# Patient Record
Sex: Female | Born: 1995 | Race: White | Hispanic: No | Marital: Single | Smoking: Current some day smoker
Health system: Southern US, Community
[De-identification: ages and names within clinical notes are randomized; demographics above are authoritative.]

## PROBLEM LIST (undated history)

## (undated) DIAGNOSIS — IMO0001 Reserved for inherently not codable concepts without codable children: Secondary | ICD-10-CM

## (undated) DIAGNOSIS — F1911 Other psychoactive substance abuse, in remission: Secondary | ICD-10-CM

## (undated) DIAGNOSIS — H9190 Unspecified hearing loss, unspecified ear: Secondary | ICD-10-CM

## (undated) DIAGNOSIS — J45909 Unspecified asthma, uncomplicated: Secondary | ICD-10-CM

## (undated) DIAGNOSIS — Z9889 Other specified postprocedural states: Secondary | ICD-10-CM

## (undated) HISTORY — PX: TYMPANOSTOMY TUBE PLACEMENT: SHX32

---
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
## 2017-09-25 ENCOUNTER — Other Ambulatory Visit: Payer: Self-pay

## 2017-09-26 ENCOUNTER — Encounter (HOSPITAL_COMMUNITY): Payer: Self-pay

## 2017-09-26 ENCOUNTER — Other Ambulatory Visit (HOSPITAL_COMMUNITY): Payer: Self-pay | Admitting: Unknown Physician Specialty

## 2017-09-27 ENCOUNTER — Other Ambulatory Visit: Payer: Self-pay

## 2017-09-30 ENCOUNTER — Ambulatory Visit (HOSPITAL_COMMUNITY)
Admission: RE | Admit: 2017-09-30 | Discharge: 2017-09-30 | Disposition: A | Discharge status: Home / Self Care | Payer: Medicaid Other | Source: Ambulatory Visit | Attending: Unknown Physician Specialty | Admitting: Unknown Physician Specialty

## 2017-09-30 ENCOUNTER — Other Ambulatory Visit (HOSPITAL_COMMUNITY): Payer: Self-pay | Admitting: Unknown Physician Specialty

## 2017-09-30 ENCOUNTER — Encounter (HOSPITAL_COMMUNITY): Payer: Self-pay

## 2017-09-30 ENCOUNTER — Ambulatory Visit (HOSPITAL_COMMUNITY): Payer: Medicaid Other

## 2017-09-30 ENCOUNTER — Other Ambulatory Visit (HOSPITAL_COMMUNITY): Payer: Self-pay | Admitting: *Deleted

## 2017-09-30 DIAGNOSIS — Z3A12 12 weeks gestation of pregnancy: Secondary | ICD-10-CM | POA: Diagnosis not present

## 2017-09-30 DIAGNOSIS — D181 Lymphangioma, any site: Secondary | ICD-10-CM

## 2017-09-30 DIAGNOSIS — O358XX9 Maternal care for other (suspected) fetal abnormality and damage, other fetus: Secondary | ICD-10-CM | POA: Diagnosis not present

## 2017-09-30 DIAGNOSIS — O289 Unspecified abnormal findings on antenatal screening of mother: Secondary | ICD-10-CM

## 2017-09-30 DIAGNOSIS — O358XX0 Maternal care for other (suspected) fetal abnormality and damage, not applicable or unspecified: Secondary | ICD-10-CM

## 2017-09-30 DIAGNOSIS — O283 Abnormal ultrasonic finding on antenatal screening of mother: Secondary | ICD-10-CM

## 2017-09-30 HISTORY — DX: Reserved for inherently not codable concepts without codable children: IMO0001

## 2017-09-30 HISTORY — DX: Other psychoactive substance abuse, in remission: F19.11

## 2017-09-30 HISTORY — DX: Unspecified hearing loss, unspecified ear: H91.90

## 2017-09-30 NOTE — Addendum Note (Signed)
Encounter addended by: Novella Rob, [place] on: 09/30/2017 1:01 PM  Actions taken: Imaging Exam ended

## 2017-09-30 NOTE — Addendum Note (Signed)
Encounter addended by: Marko Stai, RDMS on: 09/30/2017 4:56 PM  Actions taken: Imaging Exam ended

## 2017-09-30 NOTE — Addendum Note (Signed)
Encounter addended by: Abigail Butts, RDMS, RVT on: 09/30/2017 1:39 PM  Actions taken: Imaging Exam ended

## 2017-09-30 NOTE — Addendum Note (Signed)
Encounter addended by: Novella Rob, RDMS on: 09/30/2017 10:49 AM  Actions taken: Imaging Exam ended

## 2017-09-30 NOTE — Addendum Note (Signed)
Encounter addended by: Novella Rob, [place] on: 09/30/2017 1:01 PM  Actions taken: Imaging Exam ended

## 2017-09-30 NOTE — Addendum Note (Signed)
Encounter addended by: Abigail Butts, RDMS, RVT on: 09/30/2017 1:38 PM  Actions taken: Imaging Exam ended

## 2017-09-30 NOTE — Addendum Note (Signed)
Encounter addended by: Jill Poling, RDMS on: 09/30/2017 6:43 PM  Actions taken: Imaging Exam ended

## 2017-09-30 NOTE — Addendum Note (Signed)
Encounter addended by: Eusebio Friendly, RT, RVT, RDMS on: 09/30/2017 7:01 PM  Actions taken: Imaging Exam ended

## 2017-09-30 NOTE — Addendum Note (Signed)
Encounter addended by: Eusebio Friendly, RT, RVT, RDMS on: 09/30/2017 6:46 PM  Actions taken: Imaging Exam ended

## 2017-10-02 DIAGNOSIS — Z3A12 12 weeks gestation of pregnancy: Secondary | ICD-10-CM | POA: Insufficient documentation

## 2017-10-02 DIAGNOSIS — O283 Abnormal ultrasonic finding on antenatal screening of mother: Secondary | ICD-10-CM | POA: Insufficient documentation

## 2017-10-02 NOTE — Progress Notes (Signed)
Genetic Counseling  High-Risk Gestation Note  Appointment Date:  10/02/2017 Referred By: Pennie Rushing, MD Date of Birth:  [birth year] Partner: [name]   Pregnancy History: [id number] Estimated Date of Delivery: 04/13/18 Estimated Gestational Age: [redacted]w[redacted]d Attending: Griffin Dakin, MD  [name] and her partner, [name], were seen for genetic counseling because of increased fetal nuchal translucency on ultrasound.     Reviewed ultrasound findings and the association with fetal aneuploidy  Discussed additional associations including single gene, structural defect, variant of normal  Offered additional screening  NIPS for aneuploidy- currently pending, drawn 09/25/17 at [facility] office  NIPS for single gene- declined today  Detailed ultrasound- scheduled in second trimester  Discussed option of diagnostic testing  Amniocentesis- declined at this time  Reviewed ACOG carrier screening options- declined  Reviewed family history concerns   The patient had a nuchal translucency (NT) ultrasound at the referring provider's office, which revealed an NT measurement of 3.9 mm at . Ultrasound was performed today and also visualized increased NT. Complete ultrasound reported under separate cover. We discussed that the fetal NT refers to a fluid filled space between the skin and soft tissues behind the cervical spine. This space is traditionally measurable between 11 and 13.[redacted] weeks gestation and is considered enlarged when the measurement is equal to or greater than the 95th percentile for the gestational age. This couple was counseled regarding the various common etiologies for an enlarged NT including: aneuploidy, single gene conditions, cardiac or great vessel abnormalities, lymphatic system failure, decreased fetal movement, and fetal anemia.   We reviewed chromosomes, nondisjunction, and the common features and prognoses of Down syndrome and other aneuploidies. Considering KathrynTran's age, an NT of 3.9  mm and a CRL of 54 mm, the risk for fetal aneuploidy is estimated to be approximately 11%. They were then counseled regarding the availability of aneuploidy screening options. The patient had noninvasive prenatal screening (NIPS)/prenatal cell free DNA testing drawn through her OB provider ([facility]) on 09/25/17. The results are pending.   We spent time reviewing the methodology of NIPS/cfDNA including the conditions for which it screens, detection rates and false positive rates of each. They were counseled that screening tests are used to modify a patient's a priori risk for aneuploidy, typically based on age. This estimate provides a pregnancy specific risk assessment. We reviewed the benefits and limitations of screening options including NIPS and detailed ultrasound. They were also counseled regarding diagnostic testing via [facility] and amniocentesis. We reviewed the associated risks for complications, including spontaneous pregnancy loss. We discussed the possible results that the tests might provide including: positive, negative, unanticipated, and no result. Finally, they were counseled regarding the cost of each option and potential out of pocket expenses. [name] declined invasive testing at this time, as they prefer to await results of NIPS for aneuploidy before deciding on additional genetic testing in the pregnancy.   In addition, we discussed that an NT of 3.9 mm is associated with an approximate 3% risk for a fetal cardiac anomaly. We discussed the options of a fetal echocardiogram and detailed anatomy ultrasound in second trimester as methods to evaluate the fetal heart. We discussed that less commonly an increased NT can be associated with other structural anomalies and reviewed the benefits and limitations of second trimester ultrasound to assess fetal growth and anatomy. Detailed ultrasound is scheduled for 11/11/17.   We also discussed single gene conditions. They were  counseled that an increased NT is associated with an  increased chance for specific single gene conditions including Noonan spectrum disorders, skeletal dysplasias, SLOS, CdLS, and many others. We discussed that these conditions are not routinely tested for prenatally unless ultrasound findings or family history significantly increase the suspicion of a specific single gene disorder; however, there is new noninvasive prenatal screening (NIPS) technology which assesses for specific alterations in 30 genes, including the genes associated with Noonan syndrome, some skeletal dysplasias, and CdLS. We discussed that this testing, marketed as Senaida Ores, is available and requires both maternal and paternal sample to be performed. We reviewed the benefits, limitations, and cost of this technology. They understand that many of the features of these conditions can be detected by detailed ultrasound during the second trimester; however, ultrasound cannot definitively diagnose or rule out these conditions. We briefly reviewed common inheritance patterns (dominant, recessive, and X-linked) as well as the associated risks of recurrence. The couple declined single gene NIPS at this time given that [name] is currently pending. They prefer to await results of NIPS for aneuploidy prior to pursuing additional genetic screening or testing at this time.   We then discussed that an increased NT value can be a normal variant, which can resolve during the pregnancy. They were counseled that the fetal prognosis depends on the underlying etiology of the enlarged NT and further anticipatory guidance can be provided if a diagnosis is discovered. They understand that the legal limit for TAB in [place] is [redacted] weeks gestation. The couple did not indicate that they were considering TAB as an option in the current pregnancy.   Kathryn [name] was provided with written information regarding cystic fibrosis (CF), spinal muscular atrophy (SMA) and  hemoglobinopathies including the carrier frequency, availability of carrier screening and prenatal diagnosis if indicated.  In addition, we discussed that CF and hemoglobinopathies are routinely screened for as part of the [place] newborn screening panel.  After further discussion, she declined screening for CF, SMA and hemoglobinopathies today.  Both family histories were reviewed and found to be contributory for hearing loss for Ms. Terhaar. 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 She reported that the hearing loss is attributed to trauma at birth and did not describe it be progressive. We reviewed the hearing loss can be categorized as either prelingual (occurring prior to the development of speech) or postlingual (occurring after the development of normal speech). Prelingual hearing loss includes congenital hearing loss but does not necessarily have to be congenital. Additionally, hearing loss is typically categorized as either syndromic or nonsyndromic. Congenital hearing loss can be hereditary or nonhereditary. It is estimated that for prelingual hearing loss, approximately 25% is idiopathic; approximately 25% is non-genetic; and approximately 50% or greater is genetic. Nonhereditary causes of prelingual hearing loss include teratogenic exposures in pregnancy, such as congenital infection or medications, or postnatal exposures, such as infections or medication use. Regarding genetic causes of prelingual hearing loss, approximately 30% is syndromic(including over 400 genetic syndromes), and approximately 70% is nonsyndromic. There are numerous forms of both syndromic and nonsyndromic hereditary hearing loss following various patterns of inheritance including autosomal recessive, autosomal dominant, X-linked, and mitochondrial  inheritance. We discussed that the reported history for Ms. Heacox is not particularly suggestive of a syndromic cause for her hearing loss. In the case of an environmental cause, recurrence risk  would not be increased for her offspring. In the case of an underlying genetic cause, recurrence risk may be increased depending upon the underlying mechanism. Further genetic evaluation and workup may be available to Ms. Cotto if desired.    Ms. Winiarski reported her nephew (her brother's son) has autism or unknown etiology. Autism is part of the spectrum of conditions referred to as Autistic spectrum disorders (ASD).  ASDs are among the most common neurodevelopmental disorders, with approximately 1 in 68 children meeting criteria for ASD, according to the Centers for Disease Control. Approximately 80% of individuals diagnosed are female. There is strong evidence that genetic factors play a critical role in development of ASD. There have been recent advances in identifying specific genetic causes of ASD, however, there are still many individuals for whom the etiology of the ASD is not known. The majority of individuals with ASD (70-80%) have essential autism. There is strong evidence that genetic factors play a critical role in development of ASD. Some individuals with ASDs are found to have causative differences in karyotype analysis, chromosomal microarray analysis, or single genes. These are more likely to be identified in individuals with complex autism spectrum disorders.  Once a couple has a child with a diagnosis of ASD, there is a 13.5% chance to have another child with ASD. Recurrence risk data are more limited for extended degree relatives. The patient is aware that for many individuals with autism spectrum disorders an underlying genetic cause is not identified at this time. In the absence of an identified genetic etiology, prenatal screening or testing would not be available in the current pregnancy for the  autism spectrum disorders in the family.   The family histories were otherwise found to be noncontributory for birth defects, intellectual disability, and known genetic conditions. Ms. Toback reported Caucasian ancestry, and her partner reported African American ancestry. Consanguinity was denied. Without further information regarding the provided family history, an accurate genetic risk cannot be calculated. Further genetic counseling is warranted if more information is obtained.   Ms. BRITT THEARD denied exposure to environmental toxins or chemical agents. She denied the use of alcohol or street drugs. She reported smoking less than 1 cigarette per day on average. The associations of smoking in pregnancy were reviewed and cessation encouraged. She denied significant viral illnesses during the course of her pregnancy.    I counseled this couple regarding the above risks and available options. Most of the counseling was provided by Carmin Richmond, [employer] genetic counseling student, under my direct supervision. The approximate face-to-face time with the genetic counselor was 40 minutes.     Chipper Oman, MS Certified Genetic Counselor 10/02/2017

## 2017-10-14 ENCOUNTER — Other Ambulatory Visit: Payer: Self-pay

## 2017-11-10 ENCOUNTER — Encounter (HOSPITAL_COMMUNITY): Payer: Self-pay

## 2017-11-11 ENCOUNTER — Ambulatory Visit (HOSPITAL_COMMUNITY)
Admission: RE | Admit: 2017-11-11 | Discharge: 2017-11-11 | Disposition: A | Discharge status: Home / Self Care | Payer: Medicaid Other | Source: Ambulatory Visit | Attending: Unknown Physician Specialty | Admitting: Unknown Physician Specialty

## 2017-11-11 ENCOUNTER — Other Ambulatory Visit (HOSPITAL_COMMUNITY): Payer: Self-pay | Admitting: Obstetrics and Gynecology

## 2017-11-11 ENCOUNTER — Encounter (HOSPITAL_COMMUNITY): Payer: Self-pay

## 2017-11-11 DIAGNOSIS — Z3A18 18 weeks gestation of pregnancy: Secondary | ICD-10-CM | POA: Insufficient documentation

## 2017-11-11 DIAGNOSIS — O280 Abnormal hematological finding on antenatal screening of mother: Secondary | ICD-10-CM

## 2017-11-11 DIAGNOSIS — Z363 Encounter for antenatal screening for malformations: Secondary | ICD-10-CM | POA: Insufficient documentation

## 2017-11-11 DIAGNOSIS — F1210 Cannabis abuse, uncomplicated: Secondary | ICD-10-CM

## 2017-11-11 DIAGNOSIS — O99212 Obesity complicating pregnancy, second trimester: Secondary | ICD-10-CM | POA: Diagnosis not present

## 2017-11-11 DIAGNOSIS — O358XX0 Maternal care for other (suspected) fetal abnormality and damage, not applicable or unspecified: Secondary | ICD-10-CM

## 2017-11-11 DIAGNOSIS — O283 Abnormal ultrasonic finding on antenatal screening of mother: Secondary | ICD-10-CM

## 2017-11-11 DIAGNOSIS — O289 Unspecified abnormal findings on antenatal screening of mother: Secondary | ICD-10-CM | POA: Insufficient documentation

## 2017-11-11 HISTORY — DX: Other specified postprocedural states: Z98.890

## 2017-11-12 ENCOUNTER — Other Ambulatory Visit (HOSPITAL_COMMUNITY): Payer: Self-pay | Admitting: *Deleted

## 2017-11-12 DIAGNOSIS — D181 Lymphangioma, any site: Secondary | ICD-10-CM

## 2017-11-13 ENCOUNTER — Other Ambulatory Visit (HOSPITAL_COMMUNITY): Payer: Self-pay | Admitting: *Deleted

## 2017-11-13 DIAGNOSIS — O358XX0 Maternal care for other (suspected) fetal abnormality and damage, not applicable or unspecified: Secondary | ICD-10-CM

## 2017-12-09 ENCOUNTER — Encounter (HOSPITAL_COMMUNITY): Payer: Self-pay

## 2017-12-09 ENCOUNTER — Other Ambulatory Visit (HOSPITAL_COMMUNITY): Payer: Self-pay | Admitting: Obstetrics and Gynecology

## 2017-12-09 ENCOUNTER — Other Ambulatory Visit (HOSPITAL_COMMUNITY): Payer: Self-pay

## 2017-12-09 ENCOUNTER — Ambulatory Visit (HOSPITAL_COMMUNITY)
Admission: RE | Admit: 2017-12-09 | Discharge: 2017-12-09 | Disposition: A | Discharge status: Home / Self Care | Payer: Medicaid Other | Source: Ambulatory Visit | Attending: Unknown Physician Specialty | Admitting: Unknown Physician Specialty

## 2017-12-09 DIAGNOSIS — O99212 Obesity complicating pregnancy, second trimester: Secondary | ICD-10-CM

## 2017-12-09 DIAGNOSIS — F1210 Cannabis abuse, uncomplicated: Secondary | ICD-10-CM

## 2017-12-09 DIAGNOSIS — Z3A22 22 weeks gestation of pregnancy: Secondary | ICD-10-CM | POA: Diagnosis not present

## 2017-12-09 DIAGNOSIS — D181 Lymphangioma, any site: Secondary | ICD-10-CM

## 2017-12-09 DIAGNOSIS — O358XX9 Maternal care for other (suspected) fetal abnormality and damage, other fetus: Secondary | ICD-10-CM | POA: Insufficient documentation

## 2017-12-09 DIAGNOSIS — O289 Unspecified abnormal findings on antenatal screening of mother: Secondary | ICD-10-CM

## 2017-12-09 DIAGNOSIS — O280 Abnormal hematological finding on antenatal screening of mother: Secondary | ICD-10-CM

## 2017-12-09 DIAGNOSIS — O283 Abnormal ultrasonic finding on antenatal screening of mother: Secondary | ICD-10-CM

## 2017-12-09 NOTE — Addendum Note (Signed)
Encounter addended by: Novella Rob, RDMS on: 12/09/2017 3:51 PM  Actions taken: Imaging Exam ended

## 2018-01-06 ENCOUNTER — Ambulatory Visit (HOSPITAL_COMMUNITY)
Admission: RE | Admit: 2018-01-06 | Discharge: 2018-01-06 | Disposition: A | Discharge status: Home / Self Care | Payer: Medicaid Other | Source: Ambulatory Visit | Attending: Unknown Physician Specialty | Admitting: Unknown Physician Specialty

## 2018-01-06 ENCOUNTER — Encounter (HOSPITAL_COMMUNITY): Payer: Self-pay

## 2018-01-06 ENCOUNTER — Other Ambulatory Visit (HOSPITAL_COMMUNITY): Payer: Self-pay | Admitting: Obstetrics and Gynecology

## 2018-01-06 DIAGNOSIS — Q909 Down syndrome, unspecified: Secondary | ICD-10-CM | POA: Diagnosis not present

## 2018-01-06 DIAGNOSIS — O321XX0 Maternal care for breech presentation, not applicable or unspecified: Secondary | ICD-10-CM | POA: Diagnosis not present

## 2018-01-06 DIAGNOSIS — Z3A26 26 weeks gestation of pregnancy: Secondary | ICD-10-CM

## 2018-01-06 DIAGNOSIS — Z362 Encounter for other antenatal screening follow-up: Secondary | ICD-10-CM | POA: Diagnosis present

## 2018-01-06 DIAGNOSIS — O358XX0 Maternal care for other (suspected) fetal abnormality and damage, not applicable or unspecified: Secondary | ICD-10-CM | POA: Insufficient documentation

## 2018-01-06 DIAGNOSIS — O289 Unspecified abnormal findings on antenatal screening of mother: Secondary | ICD-10-CM | POA: Diagnosis not present

## 2018-01-06 DIAGNOSIS — O99212 Obesity complicating pregnancy, second trimester: Secondary | ICD-10-CM | POA: Insufficient documentation

## 2018-01-06 DIAGNOSIS — O283 Abnormal ultrasonic finding on antenatal screening of mother: Secondary | ICD-10-CM

## 2018-07-06 ENCOUNTER — Encounter (HOSPITAL_COMMUNITY): Payer: Self-pay

## 2019-07-02 IMAGING — US US MFM OB FOLLOW-UP
1 series · 14 of 28 positions shown · non-contrast
Comparison: none

[Series 1: us mfm ob follow-up · 53 acquisitions, 14 frames shown]
[im 2/53]
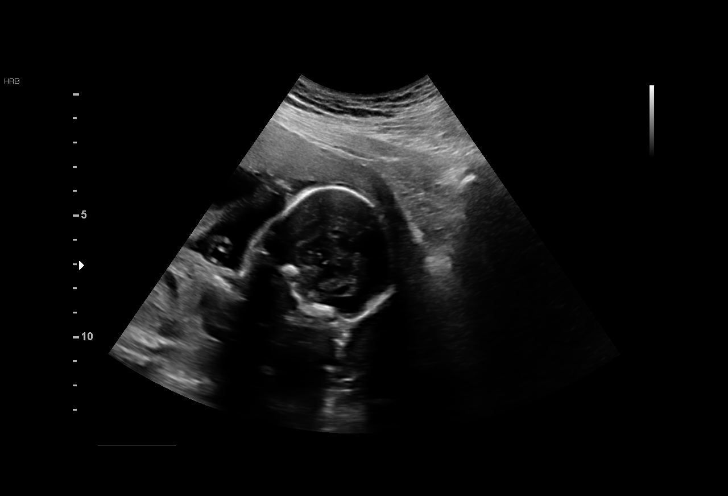
[im 6/53]
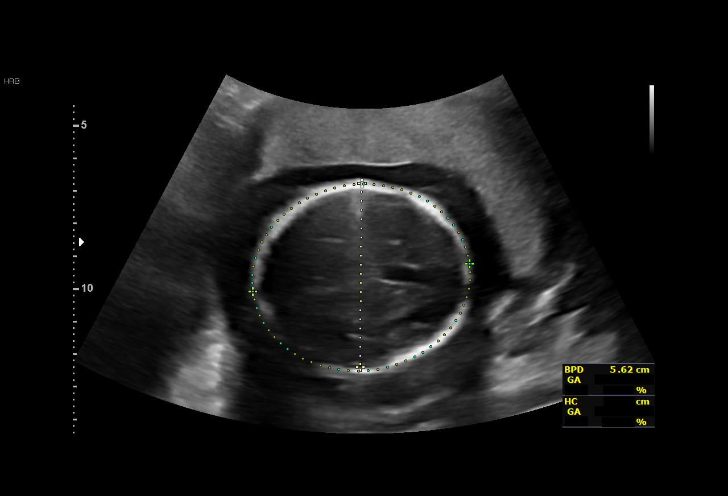
[im 10/53]
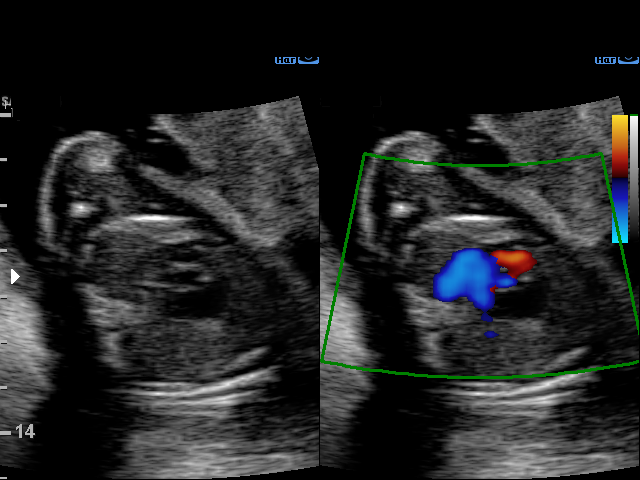
[im 14/53]
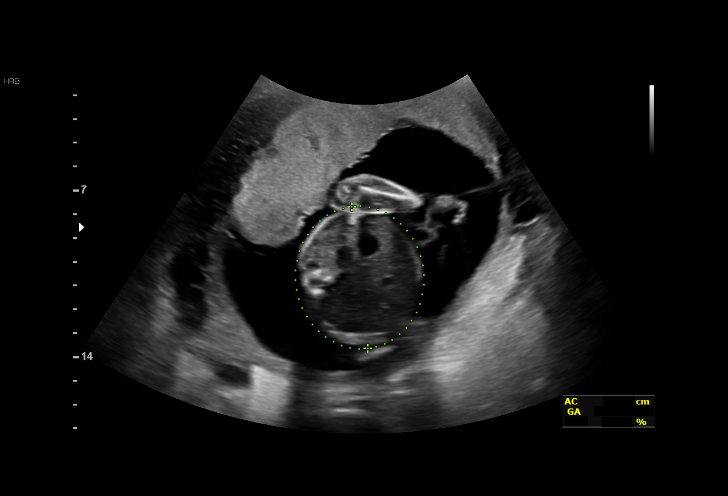
[im 18/53]
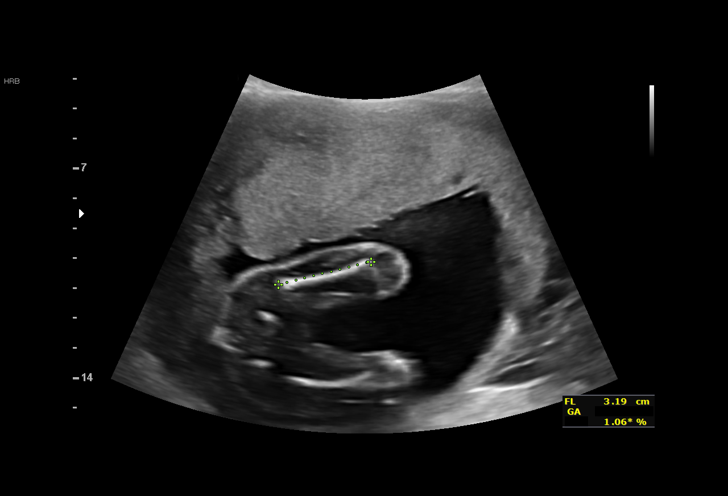
[im 22/53]
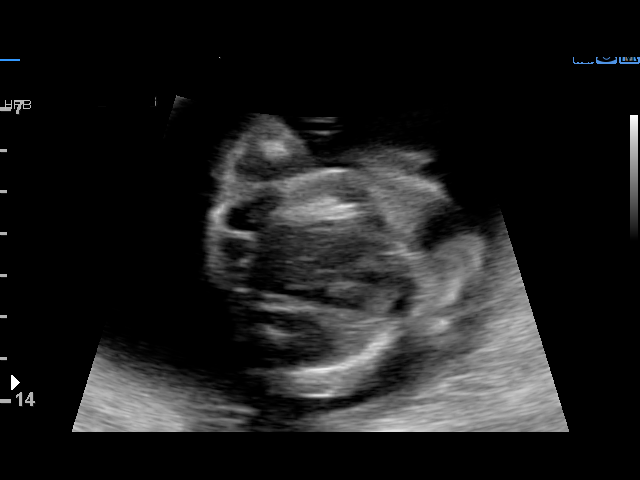
[im 26/53]
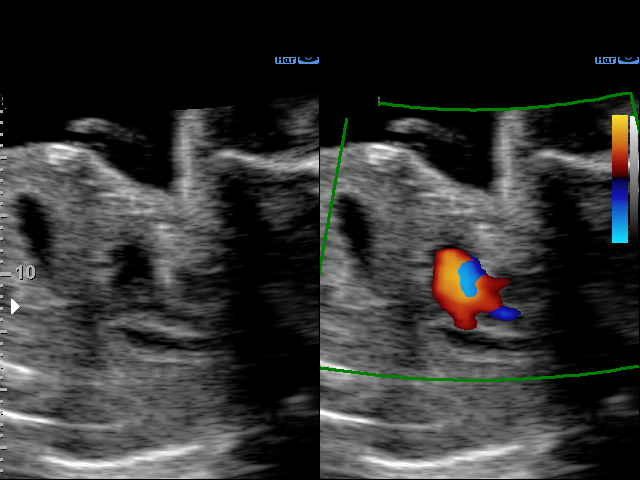
[im 29/53]
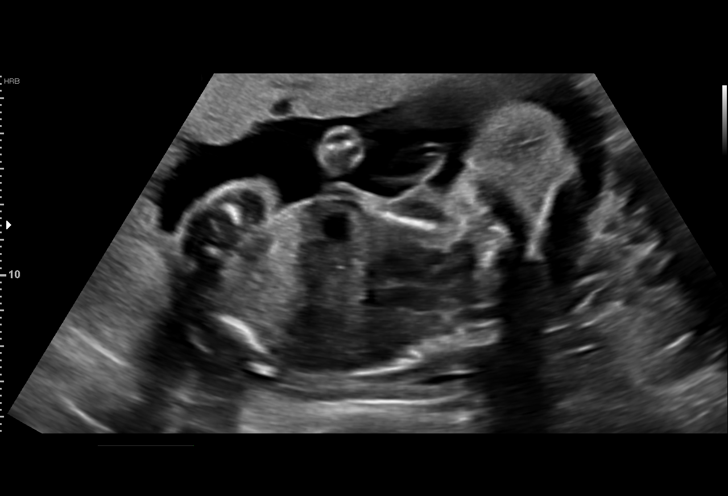
[im 33/53]
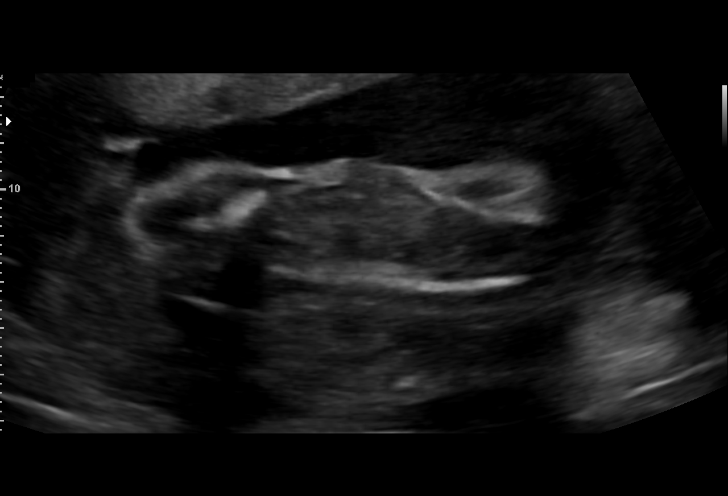
[im 37/53]
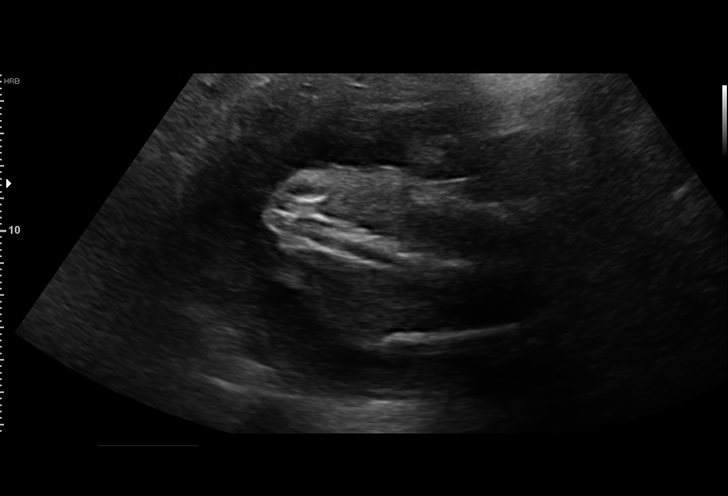
[im 41/53]
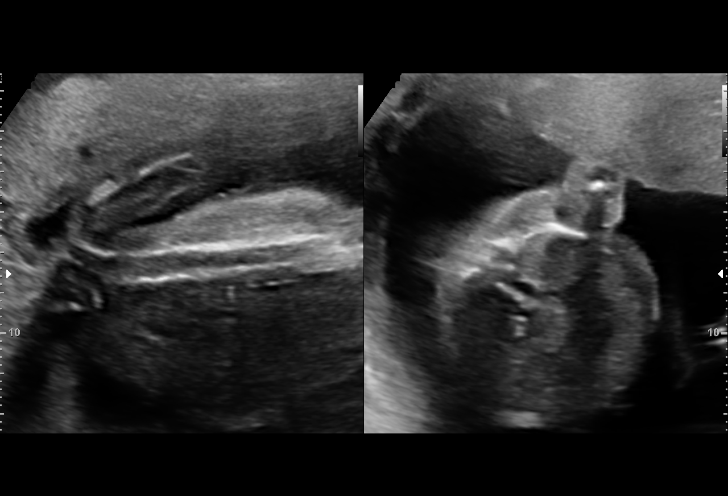
[im 45/53]
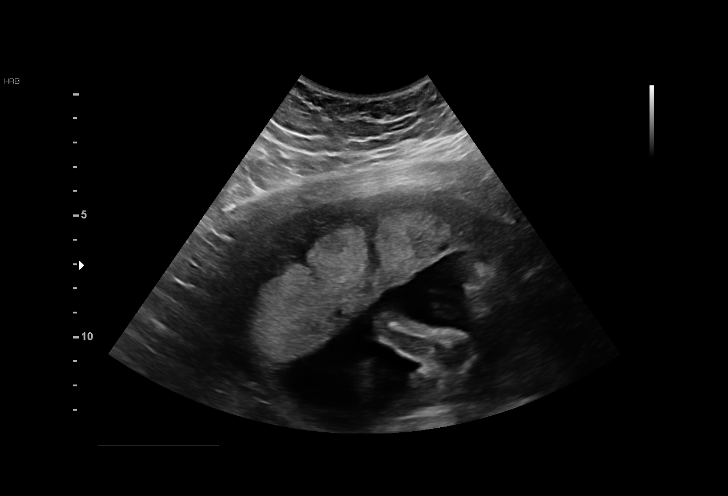
[im 49/53]
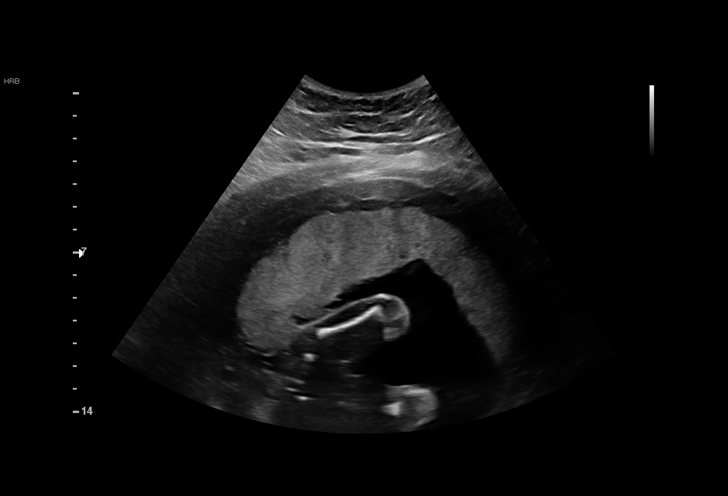
[im 53/53]
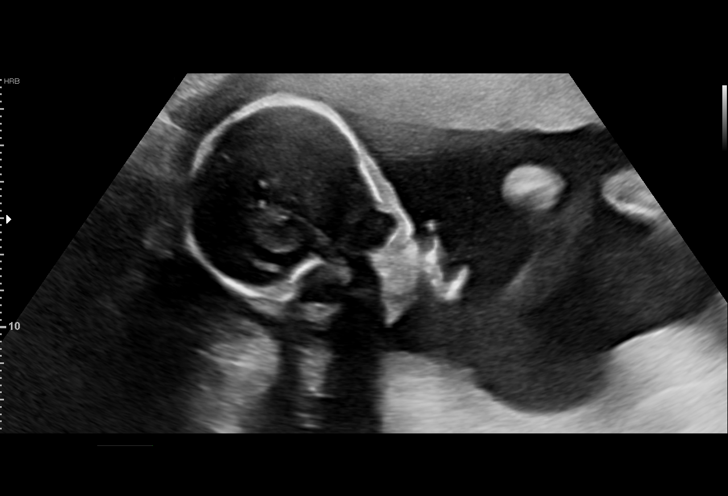

[14 of 28 positions shown; findings below may reference images not displayed]

Centre
[address],

Indications

22 weeks gestation of pregnancy
Abnormal first trimester screen (NT)
Cystic hygroma
Marijuana Abuse
Abnormal biochemical screen (quad) for
Trisomy 21 ([DATE])
Obesity complicating pregnancy, second
trimester
OB History

Gravidity:    2         Term:   1        Prem:   0        SAB:   0
TOP:          0       Ectopic:  0        Living: 1
Fetal Evaluation

Num Of Fetuses:     1
Cardiac Activity:   Observed
Placenta:           Anterior, above cervical os

Amniotic Fluid
AFI FV:      Subjectively within normal limits

Largest Pocket(cm)
7.88
Biometry

BPD:        56  mm     G. Age:  23w 1d         81  %    CI:        78.87   %    70 - 86
FL/HC:      15.8   %    18.4 -
HC:      199.4  mm     G. Age:  22w 1d         37  %    HC/AC:      1.11        1.06 -
AC:      180.4  mm     G. Age:  22w 6d         66  %    FL/BPD:     56.3   %    71 - 87
FL:       31.5  mm     G. Age:  19w 6d        < 3  %    FL/AC:      17.5   %    20 - 24
HUM:      28.2  mm     G. Age:  19w 1d        < 5  %

Est. FW:     439  gm    0 lb 15 oz      33  %
Gestational Age

LMP:           23w 3d        Date:  06/28/17                 EDD:   04/04/18
U/S Today:     22w 0d                                        EDD:   04/14/18
Best:          22w 1d     Det. By:  Early Ultrasound         EDD:   04/13/18
(09/25/17)
Anatomy

Cranium:               Appears normal         Aortic Arch:            Not well visualized
Cavum:                 Appears normal         Ductal Arch:            Not well visualized
Ventricles:            Previously seen        Diaphragm:              Appears normal
Choroid Plexus:        Previously seen        Stomach:                Appears normal, left
sided
Cerebellum:            Previously seen        Abdomen:                Appears normal
Posterior Fossa:       Previously seen        Abdominal Wall:         Appears nml (cord
insert, abd wall)
Nuchal Fold:           Previously seen        Cord Vessels:           Previously seen
Face:                  Profile nl; orbits     Kidneys:                Appear normal
visualized
Lips:                  Appears normal         Bladder:                Appears normal
Thoracic:              Appears normal         Spine:                  Ltd views no
intracranial signs of
NT
Heart:                 Appears normal         Upper Extremities:      Previously
(4CH, axis, and situs                          visualized
RVOT:                  Appears normal         Lower Extremities:      Previously
visualized
LVOT:                  Appears normal

Other:  Fetus appears to be a female. Technically difficult due to maternal
habitus and fetal position. Heels previously visualized.
Cervix Uterus Adnexa

Cervix
Length:           4.27  cm.
Normal appearance by transabdominal scan.
Impression

Singleton intrauterine pregnancy at 22+1weeks with resolved
cystic hygroma, abnormal quad screen and obesity here for
completion of the anatomic survey
Interval review of the anatomy shows no sonographic
markers for aneuploidy or structural anomalies
All relevant fetal anatomy has been visualized (heart anatomy
was seen on separate fetal echocardiogram)
Amniotic fluid volume is normal
Estimated fetal weight shows growth in the 33rd percentile
Recommendations

Recommend follow-up ultrasound examination in 4 weeks for
reassessment of growth and development

## 2024-05-07 ENCOUNTER — Other Ambulatory Visit: Payer: Self-pay

## 2024-05-07 ENCOUNTER — Emergency Department (HOSPITAL_COMMUNITY)
Admission: EM | Admit: 2024-05-07 | Discharge: 2024-05-07 | Disposition: A | Discharge status: Home / Self Care | Payer: MEDICAID | Attending: Emergency Medicine | Admitting: Emergency Medicine

## 2024-05-07 ENCOUNTER — Encounter (HOSPITAL_COMMUNITY): Payer: Self-pay

## 2024-05-07 DIAGNOSIS — L739 Follicular disorder, unspecified: Secondary | ICD-10-CM | POA: Insufficient documentation

## 2024-05-07 DIAGNOSIS — M722 Plantar fascial fibromatosis: Secondary | ICD-10-CM | POA: Diagnosis not present

## 2024-05-07 DIAGNOSIS — M79672 Pain in left foot: Secondary | ICD-10-CM | POA: Diagnosis present

## 2024-05-07 LAB — CBG MONITORING, ED: Glucose-Capillary: 101 mg/dL — ABNORMAL HIGH (ref 70–99)

## 2024-05-07 MED ORDER — DOXYCYCLINE HYCLATE 100 MG PO CAPS
100.0000 mg | ORAL_CAPSULE | Freq: Two times a day (BID) | ORAL | 0 refills | Status: AC
Start: 2024-05-07 — End: ?

## 2024-05-07 MED ORDER — MUPIROCIN 2 % EX OINT: 1.0000 | TOPICAL_OINTMENT | Freq: Two times a day (BID) | CUTANEOUS | 1 refills | Status: AC

## 2024-05-07 NOTE — ED Provider Notes (Signed)
 [place] [facility] AT [facility] Provider Note   CSN: [id number] Arrival date & time: 05/07/24  1258     Patient presents with: Wound Check   Kathryn [name] is a 28 y.o. female.    Wound Check Pertinent negatives include no chest pain and no shortness of breath.       Kathryn [name] is a 28 y.o. female who presents to the Emergency Department for evaluation of two small open sores to her right lower leg.  The sores have been present for a month and are not healing.  She has been experiencing recurrent slightly painful pustules to both lower legs in various stages of healing.  She was previously treated with Keflex but denies improvement of her symptoms.  She was told her symptoms may be related to MRSA.  She denies injury, swelling, fever and chills.    She is also complaining of pain to her left foot for some time.  Pain worse in the morning upon standing and improves after standing and walking.  Pain of her foot is located at her arch and heel area. Admits to wearing mostly Crocs.  Denies numbness or swelling of her foot or known injury  Prior to Admission medications   Medication Sig Start Date End Date Taking? Authorizing Provider  HYDROcodone -acetaminophen  (NORCO/VICODIN) 5-325 MG per tablet Take one-two tabs po q 4-6 hrs prn pain Patient not taking: Reported on 09/30/2017 07/19/14   Etoy Mcdonnell, PA-C  HYDROcodone -homatropine (HYCODAN) 5-1.5 MG/5ML syrup Take 5 mLs by mouth every 6 (six) hours as needed. Patient not taking: Reported on 09/30/2017 12/08/14   Armida Culver, PA-C  ibuprofen (ADVIL,MOTRIN) 200 MG tablet Take 200 mg by mouth every 6 (six) hours as needed for pain.    [provider]  loratadine -pseudoephedrine  (CLARITIN -D 12 HOUR) 5-120 MG per tablet Take 1 tablet by mouth 2 (two) times daily. Patient not taking: Reported on 09/30/2017 12/08/14   Armida Culver, PA-C  Naproxen Sodium (ALEVE) 220 MG CAPS Take 220 mg by mouth daily as  needed (for pain).    [provider]  Prenatal Vit-Fe Fumarate-FA (PRENATAL VITAMIN PO) Take by mouth.    [provider]    Allergies: Patient has no known allergies.    Review of Systems  Constitutional:  Negative for chills and fever.  Respiratory:  Negative for shortness of breath.   Cardiovascular:  Negative for chest pain.  Gastrointestinal:  Negative for nausea and vomiting.  Musculoskeletal:  Positive for arthralgias (left foot pain).  Skin:  Positive for wound.  Neurological:  Negative for weakness and numbness.    Updated Vital Signs BP 136/86   Pulse 86   Temp 98.4 F (36.9 C)   Resp 20   Ht 5' 5 (1.651 m)   Wt (!) 157.4 kg   SpO2 99%   BMI 57.74 kg/m   Physical Exam Vitals and nursing note reviewed.  Constitutional:      General: She is not in acute distress.    Appearance: Normal appearance. She is not ill-appearing.  Cardiovascular:     Rate and Rhythm: Normal rate and regular rhythm.     Pulses: Normal pulses.  Pulmonary:     Effort: Pulmonary effort is normal.  Musculoskeletal:        General: No swelling. Normal range of motion.     Left foot: Normal range of motion and normal capillary refill. No swelling or bony tenderness. Normal pulse.     Comments:  Ttp at the plantar surface of the left heel and tender at arch of the foot.  No erythema or edema.  Cap refill is good and pulses of the foot are palpable.  Extremity warm and pink  Skin:    General: Skin is warm.     Comments: Scattered erythematous pustules to bilateral lower legs.  Two 1-2 cm open wounds to right lower leg without drainage, surrounding erythema or active drainage.  No lymphangitis.  Compartments of the extremities are soft.   Neurological:     Mental Status: She is alert.     (all labs ordered are listed, but only abnormal results are displayed) Labs Reviewed  CBG MONITORING, ED - Abnormal; Notable for the following components:      Result Value    Glucose-Capillary 101 (*)    All other components within normal limits    EKG: None  Radiology: No results found.   Procedures   Medications Ordered in the ED - No data to display                                  Medical Decision Making Pt here with pustules to BLE's.  States two open areas are not healing and was previously treated with Keflex. No fever or chills   I suspect folliculitis secondary to MRSA.  There is no abscess or surrounding erythema to suggest a secondary cellulitis.  Zoster and pox virus considered but distribution not in dermatome and localized to BLE's. Pt is very welling appearing and vitals are reassuring.   Left foot pain likely plantar fasciitis.  No reported injury to suggest fx.  No open wounds of the foot to suggest infection  Amount and/or Complexity of Data Reviewed Labs: ordered.    Details: CBG 101 Discussion of management or test interpretation with external provider(s): Patient well-appearing.  No clinical concern for sepsis or cellulitis.  She has likely MRSA infection with several pustules present to the LE's that appear in various stages of healing.  No obvious cellulitis.  Extremities are neurovascularly intact.  Will treat with antibiotics, discussed proper wound care and close outpatient follow-up.  Return precautions were discussed. Orthotic  and proper foot wear recommended for PF  Risk Prescription drug management.        Final diagnoses:  Folliculitis  Plantar fasciitis    ED Discharge Orders     None          Herlinda Milling, PA-C 05/09/24 1000    Francesca Elsie CROME, MD 05/09/24 1719

## 2024-05-07 NOTE — ED Notes (Addendum)
 ED Provider at bedside.

## 2024-05-07 NOTE — ED Notes (Signed)
 Pt states she last ate or drank anything was since 0900.

## 2024-05-07 NOTE — ED Triage Notes (Signed)
 Pt has several open wounds on the back of her right calf that she has had for over a month. Pt stated that UC told her it was MRSA. Pt stated that she keeps it clean but they will not heal.

## 2024-05-07 NOTE — Discharge Instructions (Signed)
 Take the medication as directed.  Take the antibiotic by mouth as directed until finished.  Clean the areas to your lower legs with antibacterial soap and water.  Dry then apply ointment 3 times a day.  As discussed, I recommend that you wear supportive shoes and you may get an insole to put in your shoes for plantar fasciitis that may help as well.  Follow-up with your family doctor for recheck.  Return to emergency department for any new or worsening symptoms.
